# Patient Record
Sex: Female | Born: 1975 | Race: Black or African American | Hispanic: No | Marital: Single | Smoking: Never smoker
Health system: Southern US, Community
[De-identification: ages and names within clinical notes are randomized; demographics above are authoritative.]

---

## 2007-01-11 ENCOUNTER — Other Ambulatory Visit: Admission: RE | Admit: 2007-01-11 | Discharge: 2007-01-11 | Payer: Self-pay | Admitting: Obstetrics and Gynecology

## 2007-01-16 ENCOUNTER — Emergency Department (HOSPITAL_COMMUNITY): Admission: EM | Admit: 2007-01-16 | Discharge: 2007-01-16 | Payer: Self-pay | Admitting: Family Medicine

## 2008-02-19 ENCOUNTER — Other Ambulatory Visit: Admission: RE | Admit: 2008-02-19 | Discharge: 2008-02-19 | Payer: Self-pay | Admitting: Obstetrics and Gynecology

## 2009-03-25 ENCOUNTER — Other Ambulatory Visit: Admission: RE | Admit: 2009-03-25 | Discharge: 2009-03-25 | Payer: Self-pay | Admitting: Obstetrics and Gynecology

## 2009-08-14 ENCOUNTER — Encounter: Admission: RE | Admit: 2009-08-14 | Discharge: 2009-08-14 | Payer: Self-pay | Admitting: Family Medicine

## 2010-07-22 ENCOUNTER — Other Ambulatory Visit: Admission: RE | Admit: 2010-07-22 | Discharge: 2010-07-22 | Payer: Self-pay | Admitting: Obstetrics and Gynecology

## 2010-12-14 ENCOUNTER — Encounter: Payer: Self-pay | Admitting: Family Medicine

## 2012-04-24 ENCOUNTER — Other Ambulatory Visit (HOSPITAL_COMMUNITY)
Admission: RE | Admit: 2012-04-24 | Discharge: 2012-04-24 | Disposition: A | Discharge status: Home / Self Care | Payer: PRIVATE HEALTH INSURANCE | Source: Ambulatory Visit | Attending: Obstetrics and Gynecology | Admitting: Obstetrics and Gynecology

## 2012-04-24 DIAGNOSIS — Z01419 Encounter for gynecological examination (general) (routine) without abnormal findings: Secondary | ICD-10-CM | POA: Insufficient documentation

## 2012-04-24 DIAGNOSIS — Z1159 Encounter for screening for other viral diseases: Secondary | ICD-10-CM | POA: Insufficient documentation

## 2012-04-24 DIAGNOSIS — Z113 Encounter for screening for infections with a predominantly sexual mode of transmission: Secondary | ICD-10-CM | POA: Insufficient documentation

## 2013-01-22 ENCOUNTER — Ambulatory Visit (INDEPENDENT_AMBULATORY_CARE_PROVIDER_SITE_OTHER): Payer: BC Managed Care – PPO | Admitting: Internal Medicine

## 2013-01-22 ENCOUNTER — Encounter: Payer: Self-pay | Admitting: Internal Medicine

## 2013-01-22 VITALS — BP 100/60 | HR 80 | Temp 98.5°F | Ht 61.0 in | Wt 137.6 lb

## 2013-01-22 DIAGNOSIS — R05 Cough: Secondary | ICD-10-CM

## 2013-01-22 MED ORDER — PANTOPRAZOLE SODIUM 40 MG PO TBEC: 40.0000 mg | DELAYED_RELEASE_TABLET | Freq: Every day | ORAL | Status: DC

## 2013-01-22 MED ORDER — FAMOTIDINE 20 MG PO TABS: ORAL_TABLET | ORAL | Status: DC

## 2013-01-22 NOTE — Assessment & Plan Note (Signed)
The most common causes of chronic cough in immunocompetent adults include the following: upper airway cough syndrome (UACS), previously referred to as postnasal drip syndrome (PNDS), which is caused by variety of rhinosinus conditions; (2) asthma; (3) GERD; (4) chronic bronchitis from cigarette smoking or other inhaled environmental irritants; (5) nonasthmatic eosinophilic bronchitis; and (6) bronchiectasis.   These conditions, singly or in combination, have accounted for up to 94% of the causes of chronic cough in prospective studies.   Other conditions have constituted no >6% of the causes in prospective studies These have included bronchogenic carcinoma, chronic interstitial pneumonia, sarcoidosis, left ventricular failure, ACEI-induced cough, and aspiration from a condition associated with pharyngeal dysfunction.    Chronic cough is often simultaneously caused by more than one condition. A single cause has been found from 38 to 82% of the time, multiple causes from 18 to 62%. Multiply caused cough has been the result of three diseases up to 42% of the time.       Of the three most common causes of chronic cough, only one (GERD)  can actually cause the other two (asthma and post nasal drip syndrome)  and perpetuate the cylce of cough inducing airway trauma, inflammation, heightened sensitivity to reflux which is prompted by the cough itself via a cyclical mechanism.    This may partially respond to steroids and look like asthma and post nasal drainage but never erradicated completely unless the cough and the secondary reflux are eliminated, preferably both at the same time.  While not intuitively obvious, many patients with chronic low grade reflux do not cough until there is a secondary insult that disturbs the protective epithelial barrier and exposes sensitive nerve endings.  This can be viral or direct physical injury such as with an endotracheal tube.   The point is that once this occurs, it is  difficult to eliminate using anything but a maximally effective acid suppression regimen at least in the short run, accompanied by an appropriate diet to address non acid GERD.   Discussed with pt The standardized cough guidelines published in Chest by [place] in 2006 are still the best available and consist of a multiple step process (up to 12!) , not a single office visit,  and are intended  to address this problem logically,  with an alogrithm dependent on response to empiric treatment at  each progressive step  to determine a specific diagnosis with  minimal addtional testing needed. Therefore if adherence is an issue or can't be accurately verified,  it's very unlikely the standard evaluation and treatment will be successful here.    Furthermore, response to therapy (other than acute cough suppression, which should only be used short term with avoidance of narcotic containing cough syrups if possible), can be a gradual process for which the patient may perceive immediate benefit.  Unlike going to an eye doctor where the best perscription is almost always the first one and is immediately effective, this is almost never the case in the management of chronic cough syndromes. Therefore the patient needs to commit up front to consistently adhere to recommendations  for up to 6 weeks of therapy directed at the likely underlying problem(s) before the response can be reasonably evaluated.

## 2013-01-22 NOTE — Patient Instructions (Signed)
Try protonix 40 mg  Take 30-60 min before first meal of the day and Pepcid 20 mg one bedtime until cough is completely gone for at least a week without the need for cough suppression  Best cough medication is delsym  GERD (REFLUX)  is an extremely common cause of respiratory symptoms, many times with no significant heartburn at all.    It can be treated with medication, but also with lifestyle changes including avoidance of late meals, excessive alcohol, smoking cessation, and avoid fatty foods, chocolate, peppermint, colas, red wine, and acidic juices such as orange juice.  NO MINT OR MENTHOL PRODUCTS SO NO COUGH DROPS  USE SUGARLESS CANDY INSTEAD (jolley ranchers or Stover's or life saver)  NO OIL BASED VITAMINS - use powdered substitutes.  If not better in 4 weeks call for further evaluation.     Marland Kitchen

## 2013-01-22 NOTE — Progress Notes (Signed)
Subjective:    Patient ID: Sheryl Wagner, female    DOB: [birth year]   MRN: [id number]  HPI  14 ybf never smoker works in med records at [employer] referred by Elvera Lennox for recurrent cough since 2011.  01/22/2013  1st pulmonary eval cc cough tends to come one with heat coming on catching a cold then coughs for months at a time x 3 years, mostly dry assoc with tickle in throat but no excess rhinorrhea, coughs to point of gagging. This episode started abruptly in Nov 2013 and is the longest of the episodes which typically burn themselves out completely for months at a time.  Already tried zpack, prednisone, clariton > zero benefit  Sleeping ok without nocturnal  or early am exacerbation  of respiratory  c/o's or need for noct saba. Also denies any obvious fluctuation of symptoms with weather or environmental changes or other aggravating or alleviating factors except as outlined above     Kouffman Reflux v Neurogenic Cough Differentiator Reflux Comments  Do you awaken from a sound sleep coughing violently?                            With trouble breathing? no   Do you have choking episodes when you cannot  Get enough air, gasping for air ?              Yes   Do you usually cough when you lie down into  The bed, or when you just lie down to rest ?                          no   Do you usually cough after meals or eating?         no   Do you cough when (or after) you bend over?    no   GERD SCORE     Kouffman Reflux v Neurogenic Cough Differentiator Neurogenic   Do you more-or-less cough all day long? Varies    Does change of temperature make you cough? no   Does laughing or chuckling cause you to cough? yes   Do fumes (perfume, automobile fumes, burned  Toast, etc.,) cause you to cough ?      no   Does speaking, singing, or talking on the phone cause you to cough   ?               no   Neurogenic/Airway score         Review of Systems  Constitutional: Negative for fever,  chills and unexpected weight change.  HENT: Negative for ear pain, nosebleeds, congestion, sore throat, rhinorrhea, sneezing, trouble swallowing, dental problem, voice change, postnasal drip and sinus pressure.   Eyes: Negative for visual disturbance.  Respiratory: Positive for cough. Negative for choking and shortness of breath.   Cardiovascular: Negative for chest pain and leg swelling.  Gastrointestinal: Negative for vomiting, abdominal pain and diarrhea.  Genitourinary: Negative for difficulty urinating.  Musculoskeletal: Negative for arthralgias.  Skin: Negative for rash.  Neurological: Negative for tremors, syncope and headaches.  Hematological: Does not bruise/bleed easily.       Objective:   Physical Exam   Amb bf nad  Wt Readings from Last 3 Encounters:  01/22/13 137 lb 9.6 oz (62.415 kg)    HEENT: nl dentition, turbinates, and orophanx. Nl external ear canals without cough reflex   NECK :  without JVD/Nodes/TM/ nl carotid upstrokes bilaterally   LUNGS: no acc muscle use, clear to A and P bilaterally without cough on insp or exp maneuvers   CV:  RRR  no s3 or murmur or increase in P2, no edema   ABD:  soft and nontender with nl excursion in the supine position. No bruits or organomegaly, bowel sounds nl  MS:  warm without deformities, calf tenderness, cyanosis or clubbing  SKIN: warm and dry without lesions    NEURO:  alert, approp, no deficits      Assessment & Plan:

## 2013-02-19 ENCOUNTER — Ambulatory Visit: Payer: BC Managed Care – PPO | Admitting: Internal Medicine

## 2013-10-17 ENCOUNTER — Other Ambulatory Visit (HOSPITAL_COMMUNITY)
Admission: RE | Admit: 2013-10-17 | Discharge: 2013-10-17 | Disposition: A | Discharge status: Home / Self Care | Payer: BC Managed Care – PPO | Source: Ambulatory Visit | Attending: Obstetrics and Gynecology | Admitting: Obstetrics and Gynecology

## 2013-10-17 ENCOUNTER — Other Ambulatory Visit: Payer: Self-pay | Admitting: Obstetrics and Gynecology

## 2013-10-17 DIAGNOSIS — Z113 Encounter for screening for infections with a predominantly sexual mode of transmission: Secondary | ICD-10-CM | POA: Insufficient documentation

## 2013-10-17 DIAGNOSIS — Z01419 Encounter for gynecological examination (general) (routine) without abnormal findings: Secondary | ICD-10-CM | POA: Insufficient documentation

## 2013-10-17 DIAGNOSIS — Z1151 Encounter for screening for human papillomavirus (HPV): Secondary | ICD-10-CM | POA: Insufficient documentation

## 2015-01-06 ENCOUNTER — Other Ambulatory Visit (HOSPITAL_COMMUNITY)
Admission: RE | Admit: 2015-01-06 | Discharge: 2015-01-06 | Disposition: A | Discharge status: Home / Self Care | Payer: BC Managed Care – PPO | Source: Ambulatory Visit | Attending: Obstetrics and Gynecology | Admitting: Obstetrics and Gynecology

## 2015-01-06 ENCOUNTER — Other Ambulatory Visit: Payer: Self-pay | Admitting: Obstetrics and Gynecology

## 2015-01-06 DIAGNOSIS — Z113 Encounter for screening for infections with a predominantly sexual mode of transmission: Secondary | ICD-10-CM | POA: Diagnosis present

## 2015-01-06 DIAGNOSIS — Z01419 Encounter for gynecological examination (general) (routine) without abnormal findings: Secondary | ICD-10-CM | POA: Diagnosis not present

## 2015-01-07 LAB — CYTOLOGY - PAP

## 2016-01-19 ENCOUNTER — Other Ambulatory Visit (HOSPITAL_COMMUNITY)
Admission: RE | Admit: 2016-01-19 | Discharge: 2016-01-19 | Disposition: A | Discharge status: Home / Self Care | Payer: BC Managed Care – PPO | Source: Ambulatory Visit | Attending: Obstetrics and Gynecology | Admitting: Obstetrics and Gynecology

## 2016-01-19 ENCOUNTER — Other Ambulatory Visit: Payer: Self-pay | Admitting: Obstetrics and Gynecology

## 2016-01-19 DIAGNOSIS — Z01419 Encounter for gynecological examination (general) (routine) without abnormal findings: Secondary | ICD-10-CM | POA: Diagnosis present

## 2016-01-19 DIAGNOSIS — Z113 Encounter for screening for infections with a predominantly sexual mode of transmission: Secondary | ICD-10-CM | POA: Diagnosis present

## 2016-01-21 LAB — CYTOLOGY - PAP

## 2016-02-09 ENCOUNTER — Ambulatory Visit: Payer: BC Managed Care – PPO | Admitting: Podiatry

## 2016-02-11 ENCOUNTER — Ambulatory Visit: Payer: BC Managed Care – PPO | Admitting: Podiatry

## 2016-02-20 ENCOUNTER — Ambulatory Visit (INDEPENDENT_AMBULATORY_CARE_PROVIDER_SITE_OTHER): Payer: BC Managed Care – PPO

## 2016-02-20 ENCOUNTER — Encounter: Payer: Self-pay | Admitting: Podiatry

## 2016-02-20 ENCOUNTER — Ambulatory Visit (INDEPENDENT_AMBULATORY_CARE_PROVIDER_SITE_OTHER): Payer: BC Managed Care – PPO | Admitting: Podiatry

## 2016-02-20 VITALS — BP 92/53 | HR 83 | Resp 16 | Ht 61.0 in | Wt 126.0 lb

## 2016-02-20 DIAGNOSIS — M21619 Bunion of unspecified foot: Secondary | ICD-10-CM | POA: Diagnosis not present

## 2016-02-20 DIAGNOSIS — M779 Enthesopathy, unspecified: Secondary | ICD-10-CM

## 2016-02-20 NOTE — Progress Notes (Signed)
   Subjective:    Patient ID: Sheryl Wagner, female    DOB: 12-May-1976, 40 y.o.   MRN: [id number]  HPI Patient presents with bilateral foot pain; bunions; Bilateral; bunions; pt stated, "Left foot hurts worse and wants to discuss getting surgery done on Left foot"   Review of Systems  All other systems reviewed and are negative.      Objective:   Physical Exam        Assessment & Plan:

## 2016-02-20 NOTE — Progress Notes (Signed)
Subjective:     Patient ID: Sheryl Wagner, female   DOB: 04-12-1976, [age] y.o.   MRN: [id number]  HPI patient presents stating she's had a very painful bunion on her left foot that's making walking difficult and she also has one on the right that's not as sore. Has family history of this condition and no she needs to have a corrected and presents for consult for correction. States she's tried wider shoes and other modalities   Review of Systems  All other systems reviewed and are negative.      Objective:   Physical Exam  Constitutional: She is oriented to person, place, and time.  Cardiovascular: Intact distal pulses.   Musculoskeletal: Normal range of motion.  Neurological: She is oriented to person, place, and time.  Skin: Skin is warm.  Nursing note and vitals reviewed.  neurovascular status found to be intact muscle strength adequate range of motion within normal limits with patient found to have hyperostosis medial aspect first metatarsal head left with redness around the joint surface and pain when palpated. There is deviation of the hallux against the second toe and there is good range of motion first MPJ bilateral. Mild deformity noted on right and patient's found to have good digital perfusion and is well oriented 3     Assessment:     Structural bunion deformity left over right with symptomatic redness and pain around the first metatarsal head left and mild deviation of the hallux left over right    Plan:     H&P and condition reviewed with patient at great length. Due to symptomatic findings and failure to respond to different conservative modalities she has opted for surgical intervention and I discussed her x-rays with her and recommended distal osteotomy even that we may not get complete correction on the x-ray I do believe clinically it will function very well. Patient wants surgery and at this point wants to do consent form and she came here for the procedure and I allowed  her to read a consent form reviewing alternative treatments and complications as listed. She understands total recovery. Can take approximate 6 months and there is no long-term guarantees and she signs consent form after review. She is given all preoperative instructions and walking boot with instructions on getting used to it prior to procedure and is scheduled for outpatient surgery. She is encouraged to call with any questions prior to procedure  X-ray report left indicate elevation of the intermetatarsal angle between the first and second metatarsals of approximate 17 and 13 on the right. There is still tibial sesamoidal shift position left over right

## 2016-02-20 NOTE — Patient Instructions (Signed)
Pre-Operative Instructions  Congratulations, you have decided to take an important step to improving your quality of life.  You can be assured that the doctors of [facility] will be with you every step of the way.  1. Plan to be at the surgery center/hospital at least 1 (one) hour prior to your scheduled time unless otherwise directed by the surgical center/hospital staff.  You must have a responsible adult accompany you, remain during the surgery and drive you home.  Make sure you have directions to the surgical center/hospital and know how to get there on time. 2. For hospital based surgery you will need to obtain a history and physical form from your family physician within 1 month prior to the date of surgery- we will give you a form for you primary physician.  3. We make every effort to accommodate the date you request for surgery.  There are however, times where surgery dates or times have to be moved.  We will contact you as soon as possible if a change in schedule is required.   4. No Aspirin/Ibuprofen for one week before surgery.  If you are on aspirin, any non-steroidal anti-inflammatory medications (Mobic, Aleve, Ibuprofen) you should stop taking it 7 days prior to your surgery.  You make take Tylenol  For pain prior to surgery.  5. Medications- If you are taking daily heart and blood pressure medications, seizure, reflux, allergy, asthma, anxiety, pain or diabetes medications, make sure the surgery center/hospital is aware before the day of surgery so they may notify you which medications to take or avoid the day of surgery. 6. No food or drink after midnight the night before surgery unless directed otherwise by surgical center/hospital staff. 7. No alcoholic beverages 24 hours prior to surgery.  No smoking 24 hours prior to or 24 hours after surgery. 8. Wear loose pants or shorts- loose enough to fit over bandages, boots, and casts. 9. No slip on shoes, sneakers are best. 10. Bring  your boot with you to the surgery center/hospital.  Also bring crutches or a walker if your physician has prescribed it for you.  If you do not have this equipment, it will be provided for you after surgery. 11. If you have not been contracted by the surgery center/hospital by the day before your surgery, call to confirm the date and time of your surgery. 12. Leave-time from work may vary depending on the type of surgery you have.  Appropriate arrangements should be made prior to surgery with your employer. 13. Prescriptions will be provided immediately following surgery by your doctor.  Have these filled as soon as possible after surgery and take the medication as directed. 14. Remove nail polish on the operative foot. 15. Wash the night before surgery.  The night before surgery wash the foot and leg well with the antibacterial soap provided and water paying special attention to beneath the toenails and in between the toes.  Rinse thoroughly with water and dry well with a towel.  Perform this wash unless told not to do so by your physician.  Enclosed: 1 Ice pack (please put in freezer the night before surgery)   1 Hibiclens skin cleaner   Pre-op Instructions  If you have any questions regarding the instructions, do not hesitate to call our office.  [place]: [address] , [place] [address] [phone]  [place]: [address] [place], [place] [address] [phone]  [place]: [address]  [place], [place] [address] [phone]  Dr. Richard   Tuchman DPM, Dr. Yates Weisgerber DPM Dr. Richard Sikora DPM, Dr. M. Todd Hyatt DPM, Dr. Kathryn Egerton DPM 

## 2016-03-09 ENCOUNTER — Encounter: Payer: Self-pay | Admitting: Podiatry

## 2016-03-09 DIAGNOSIS — M2012 Hallux valgus (acquired), left foot: Secondary | ICD-10-CM | POA: Diagnosis not present

## 2016-03-16 NOTE — Progress Notes (Signed)
DOS 03/09/2016 Austin Bunionectomy (cutting and moving bone) with fixation left foot.

## 2016-03-18 ENCOUNTER — Ambulatory Visit (INDEPENDENT_AMBULATORY_CARE_PROVIDER_SITE_OTHER): Payer: BC Managed Care – PPO

## 2016-03-18 ENCOUNTER — Encounter: Payer: Self-pay | Admitting: Podiatry

## 2016-03-18 ENCOUNTER — Ambulatory Visit (INDEPENDENT_AMBULATORY_CARE_PROVIDER_SITE_OTHER): Payer: BC Managed Care – PPO | Admitting: Podiatry

## 2016-03-18 VITALS — BP 99/70 | HR 69 | Resp 12

## 2016-03-18 DIAGNOSIS — M21619 Bunion of unspecified foot: Secondary | ICD-10-CM | POA: Diagnosis not present

## 2016-03-18 DIAGNOSIS — Z9889 Other specified postprocedural states: Secondary | ICD-10-CM | POA: Diagnosis not present

## 2016-03-18 NOTE — Progress Notes (Signed)
Subjective:     Patient ID: Sheryl Wagner, female   DOB: 01-Nov-1976, 40 y.o.   MRN: [id number]  HPI patient states I'm doing real well with my foot with minimal discomfort good alignment and good range of motion   Review of Systems     Objective:   Physical Exam Neurovascular status intact muscle strength adequate with good alignment of the first MPJ left with digit in good position wound edges well coapted    Assessment:     Doing well post osteotomy first metatarsal left    Plan:     H&P and x-rays reviewed with patient. I went ahead today and I have recommended continued elevation compression immobilization and dispensed surgical shoe. Patient will be seen back 2 weeks or earlier if needed  X-ray report indicates pins are in place good alignment is noted with good reduction of the IM angle

## 2016-03-19 ENCOUNTER — Telehealth: Payer: Self-pay | Admitting: *Deleted

## 2016-03-19 NOTE — Telephone Encounter (Signed)
Sheryl HongJudy - [employer] states pt is to return 03/22/2016, with restriction for walking and standing, and she needs a projected length of time they are to provide pt with these accommodations.

## 2016-04-08 ENCOUNTER — Ambulatory Visit (INDEPENDENT_AMBULATORY_CARE_PROVIDER_SITE_OTHER): Payer: BC Managed Care – PPO | Admitting: Podiatry

## 2016-04-08 ENCOUNTER — Ambulatory Visit (INDEPENDENT_AMBULATORY_CARE_PROVIDER_SITE_OTHER): Payer: BC Managed Care – PPO

## 2016-04-08 ENCOUNTER — Encounter: Payer: Self-pay | Admitting: Podiatry

## 2016-04-08 VITALS — BP 88/67 | HR 94 | Resp 16

## 2016-04-08 DIAGNOSIS — Z9889 Other specified postprocedural states: Secondary | ICD-10-CM | POA: Diagnosis not present

## 2016-04-08 DIAGNOSIS — M21619 Bunion of unspecified foot: Secondary | ICD-10-CM

## 2016-04-08 NOTE — Progress Notes (Signed)
Subjective:     Patient ID: Sheryl Wagner, female   DOB: October 04, 1976, 40 y.o.   MRN: [id number]  HPI patient states she's feeling real well with her left foot and having minimal discomfort   Review of Systems     Objective:   Physical Exam Neurovascular status intact negative Homans sign noted with hallux in rectus position left with good alignment noted and good range of motion    Assessment:     Doing well post Austin-type osteotomy left    Plan:     X-ray reviewed and allow patient to return to soft shoe gear with instructions on range of motion exercises. Reappoint 4 weeks or earlier if needed  X-ray report indicated osteotomy is healing well with pins in place and good alignment of the first metatarsal

## 2016-05-21 ENCOUNTER — Ambulatory Visit (INDEPENDENT_AMBULATORY_CARE_PROVIDER_SITE_OTHER): Payer: BC Managed Care – PPO | Admitting: Podiatry

## 2016-05-21 ENCOUNTER — Ambulatory Visit (INDEPENDENT_AMBULATORY_CARE_PROVIDER_SITE_OTHER): Payer: BC Managed Care – PPO

## 2016-05-21 DIAGNOSIS — M21619 Bunion of unspecified foot: Secondary | ICD-10-CM | POA: Diagnosis not present

## 2016-05-21 DIAGNOSIS — Z9889 Other specified postprocedural states: Secondary | ICD-10-CM

## 2016-05-21 NOTE — Progress Notes (Signed)
Subjective:     Patient ID: Sheryl Wagner, female   DOB: 1976/09/30, 40 y.o.   MRN: [id number]  HPI patient presents stating I'm doing pretty well but I feel like I don't have full motion but I am able to wear shoe gear without pain. I also could use support of my arches   Review of Systems     Objective:   Physical Exam Neurovascular status intact muscle strength adequate with excellent range of motion first MPJ but patient does have significant flattening the arch noted bilateral    Assessment:     Doing very well post osteotomy first metatarsal left with no indications of sequela and I explained its normal not to have as much motion as the other foot    Plan:     Reviewed x-rays and discussed long-term orthotics which we'll make it next visit. Patient at this time can resume normal activities and will be seen back in 2 months  X-ray report indicates that the osteotomy is healing very well with joint congruence and no indications of pathology

## 2016-07-16 ENCOUNTER — Ambulatory Visit (INDEPENDENT_AMBULATORY_CARE_PROVIDER_SITE_OTHER): Payer: BC Managed Care – PPO

## 2016-07-16 ENCOUNTER — Encounter: Payer: Self-pay | Admitting: Podiatry

## 2016-07-16 ENCOUNTER — Ambulatory Visit (INDEPENDENT_AMBULATORY_CARE_PROVIDER_SITE_OTHER): Payer: BC Managed Care – PPO | Admitting: Podiatry

## 2016-07-16 DIAGNOSIS — M2012 Hallux valgus (acquired), left foot: Secondary | ICD-10-CM

## 2016-07-16 DIAGNOSIS — Z9889 Other specified postprocedural states: Secondary | ICD-10-CM | POA: Diagnosis not present

## 2016-07-16 DIAGNOSIS — M779 Enthesopathy, unspecified: Secondary | ICD-10-CM | POA: Diagnosis not present

## 2016-07-16 NOTE — Progress Notes (Signed)
Subjective:     Patient ID: Sheryl Wagner, female   DOB: December 19, 1975, 40 y.o.   MRN: [id number]  HPI patient states that she's doing well and is walking well but knows that she's getting need inserts due to her flat arch   Review of Systems     Objective:   Physical Exam Neurovascular status intact negative Homans sign noted with excellent healing first metatarsal left with hallux in rectus position wound edges well coapted Range of motion. Patient's found to have depression of the arch noted left    Assessment:     Tendinitis-like symptomatology secondary to structure    Plan:     H&P x-ray reviewed left and scanned for custom orthotics with patient to be seen back  X-ray report indicates excellent healing of osteotomy left with pins in place and no movement

## 2016-08-10 ENCOUNTER — Ambulatory Visit: Payer: BC Managed Care – PPO | Admitting: *Deleted

## 2016-08-10 DIAGNOSIS — M779 Enthesopathy, unspecified: Secondary | ICD-10-CM

## 2016-08-10 NOTE — Progress Notes (Signed)
Patient ID: Sheryl Wagner, female   DOB: 30-Jun-1976, 40 y.o.   MRN: [id number]  Patient presents for orthotic pick up.  Verbal and written break in and wear instructions given.  Patient will follow up in 4 weeks if symptoms worsen or fail to improve.

## 2016-08-10 NOTE — Patient Instructions (Signed)

## 2016-09-03 ENCOUNTER — Ambulatory Visit: Payer: BC Managed Care – PPO | Admitting: *Deleted

## 2016-09-03 DIAGNOSIS — M779 Enthesopathy, unspecified: Secondary | ICD-10-CM

## 2016-09-03 NOTE — Progress Notes (Signed)
Patient ID: Sheryl Wagner, female   DOB: 12-10-1975, 40 y.o.   MRN: [id number]  Patient presents with complaint that orthotics are too wide for her shoes.  Verified that the orthotics fit appropriately to her foot.  Shoes that patient presented with are heels and flat ballet slipper type shoes.  Explained why the shapes of the shoes were not working with the orthotics, showing the unnaturally narrow base and very shallow depth.  Also gave her visual demonstration of orthotics to her feet and the fit of the shoes to her feet.  Patient stated she would wait to have them narrowed now that she has a better understanding and be more conscious of the shoes she is purchasing, she will take her orthotics with her when she goes shoe shopping.  We agreed that if it continues to be a problem she will come back in to discuss it further with Dr Charlsie Merlesegal.

## 2017-01-24 ENCOUNTER — Other Ambulatory Visit (HOSPITAL_COMMUNITY)
Admission: RE | Admit: 2017-01-24 | Discharge: 2017-01-24 | Disposition: A | Discharge status: Home / Self Care | Payer: BC Managed Care – PPO | Source: Ambulatory Visit | Attending: Obstetrics and Gynecology | Admitting: Obstetrics and Gynecology

## 2017-01-24 ENCOUNTER — Other Ambulatory Visit: Payer: Self-pay | Admitting: Obstetrics and Gynecology

## 2017-01-24 DIAGNOSIS — Z01419 Encounter for gynecological examination (general) (routine) without abnormal findings: Secondary | ICD-10-CM | POA: Insufficient documentation

## 2017-01-24 DIAGNOSIS — Z1151 Encounter for screening for human papillomavirus (HPV): Secondary | ICD-10-CM | POA: Diagnosis present

## 2017-01-27 LAB — CYTOLOGY - PAP
DIAGNOSIS: NEGATIVE
HPV (WINDOPATH): NOT DETECTED

## 2017-08-31 ENCOUNTER — Ambulatory Visit (INDEPENDENT_AMBULATORY_CARE_PROVIDER_SITE_OTHER): Payer: BC Managed Care – PPO

## 2017-08-31 ENCOUNTER — Other Ambulatory Visit: Payer: Self-pay | Admitting: Podiatry

## 2017-08-31 ENCOUNTER — Ambulatory Visit (INDEPENDENT_AMBULATORY_CARE_PROVIDER_SITE_OTHER): Payer: BC Managed Care – PPO | Admitting: Podiatry

## 2017-08-31 DIAGNOSIS — M21611 Bunion of right foot: Secondary | ICD-10-CM | POA: Diagnosis not present

## 2017-08-31 DIAGNOSIS — M21619 Bunion of unspecified foot: Secondary | ICD-10-CM | POA: Diagnosis not present

## 2017-08-31 DIAGNOSIS — M79671 Pain in right foot: Secondary | ICD-10-CM

## 2017-08-31 NOTE — Patient Instructions (Signed)
Pre-Operative Instructions  Congratulations, you have decided to take an important step towards improving your quality of life.  You can be assured that the doctors and staff at [facility] will be with you every step of the way.  Here are some important things you should know:  1. Plan to be at the surgery center/hospital at least 1 (one) hour prior to your scheduled time, unless otherwise directed by the surgical center/hospital staff.  You must have a responsible adult accompany you, remain during the surgery and drive you home.  Make sure you have directions to the surgical center/hospital to ensure you arrive on time. 2. If you are having surgery at [facility] or [facility], you will need a copy of your medical history and physical form from your family physician within one month prior to the date of surgery. We will give you a form for your primary physician to complete.  3. We make every effort to accommodate the date you request for surgery.  However, there are times where surgery dates or times have to be moved.  We will contact you as soon as possible if a change in schedule is required.   4. No aspirin/ibuprofen for one week before surgery.  If you are on aspirin, any non-steroidal anti-inflammatory medications (Mobic, Aleve, Ibuprofen) should not be taken seven (7) days prior to your surgery.  You make take Tylenol for pain prior to surgery.  5. Medications - If you are taking daily heart and blood pressure medications, seizure, reflux, allergy, asthma, anxiety, pain or diabetes medications, make sure you notify the surgery center/hospital before the day of surgery so they can tell you which medications you should take or avoid the day of surgery. 6. No food or drink after midnight the night before surgery unless directed otherwise by surgical center/hospital staff. 7. No alcoholic beverages 24-hours prior to surgery.  No smoking 24-hours prior or 24-hours after  surgery. 8. Wear loose pants or shorts. They should be loose enough to fit over bandages, boots, and casts. 9. Don't wear slip-on shoes. Sneakers are preferred. 10. Bring your boot with you to the surgery center/hospital.  Also bring crutches or a walker if your physician has prescribed it for you.  If you do not have this equipment, it will be provided for you after surgery. 11. If you have not been contacted by the surgery center/hospital by the day before your surgery, call to confirm the date and time of your surgery. 12. Leave-time from work may vary depending on the type of surgery you have.  Appropriate arrangements should be made prior to surgery with your employer. 13. Prescriptions will be provided immediately following surgery by your doctor.  Fill these as soon as possible after surgery and take the medication as directed. Pain medications will not be refilled on weekends and must be approved by the doctor. 14. Remove nail polish on the operative foot and avoid getting pedicures prior to surgery. 15. Wash the night before surgery.  The night before surgery wash the foot and leg well with water and the antibacterial soap provided. Be sure to pay special attention to beneath the toenails and in between the toes.  Wash for at least three (3) minutes. Rinse thoroughly with water and dry well with a towel.  Perform this wash unless told not to do so by your physician.  Enclosed: 1 Ice pack (please put in freezer the night before surgery)   1 Hibiclens skin cleaner     Pre-op instructions  If you have any questions regarding the instructions, please do not hesitate to call our office.  [place]: [address], [place], [place] [address] -- [phone]  [place]: [address] , [place] [address] -- [phone]  [place]: [address]  [place], [place] [address] -- [phone]  [place]: [address], [place], [place] [address] -- [phone]  Website:  [email or web address] 

## 2017-09-01 ENCOUNTER — Ambulatory Visit: Payer: BC Managed Care – PPO | Admitting: Podiatry

## 2017-09-01 NOTE — Progress Notes (Signed)
Subjective:    Patient ID: Sheryl Wagner, female   DOB: 41 y.o.   MRN: [id number]   HPI patient states the bunion on my right foot has really been bothering me and the left one that was fixed is doing great and I know I'm getting need to have this one fixed like the other. 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 Due to young age and advanced deformity and history with the left she wants the right fixed and at this time I allowed her to read a consent form for correction going over all possible complications and alternative treatments as listed. Patient wants surgery signed consent form and was dispensed a air fracture walker for the right one and was explained how to use this. Patient understands total recovery can take 6 months to one year and discussed 1 did so well no guarantee the other one well  X-ray indicates that the patient has an elevation of the intermetatarsal angle with enlargement of bone around the first metatarsal head right

## 2017-09-27 ENCOUNTER — Encounter: Payer: Self-pay | Admitting: Podiatry

## 2017-09-27 DIAGNOSIS — M2011 Hallux valgus (acquired), right foot: Secondary | ICD-10-CM | POA: Diagnosis not present

## 2017-09-29 ENCOUNTER — Telehealth: Payer: Self-pay | Admitting: Podiatry

## 2017-09-29 NOTE — Telephone Encounter (Signed)
Left message encouraging pt to call with concerns, and I gave he an over view of her post op instructions, not to be up on the surgery foot or have below the heart more than 15 minutes per hour, remain in the boot at all times even to sleep, keep the dressing in place clean and dry until the 1st post op checks.

## 2017-09-29 NOTE — Telephone Encounter (Signed)
I'm calling in regards to the bunion surgery I had on Tuesday. Please call me back at [phone].

## 2017-09-30 NOTE — Telephone Encounter (Signed)
Left message to call with concerns. 

## 2017-09-30 NOTE — Telephone Encounter (Signed)
Yes, this is Sheryl Wagner calling back regarding the bunion surgery I had on Tuesday. When you get this message, can you please return my call at [phone]. Thank you. Bye.

## 2017-10-03 NOTE — Progress Notes (Signed)
DOS 09/27/2017 [place] Bunionectomy RT

## 2017-10-05 ENCOUNTER — Ambulatory Visit (INDEPENDENT_AMBULATORY_CARE_PROVIDER_SITE_OTHER): Payer: BC Managed Care – PPO | Admitting: Podiatry

## 2017-10-05 ENCOUNTER — Ambulatory Visit (INDEPENDENT_AMBULATORY_CARE_PROVIDER_SITE_OTHER): Payer: BC Managed Care – PPO

## 2017-10-05 ENCOUNTER — Encounter: Payer: Self-pay | Admitting: Podiatry

## 2017-10-05 VITALS — BP 103/61 | HR 72 | Temp 98.3°F

## 2017-10-05 DIAGNOSIS — M21619 Bunion of unspecified foot: Secondary | ICD-10-CM

## 2017-10-05 NOTE — Progress Notes (Signed)
Subjective:    Patient ID: Sheryl Wagner, female   DOB: 41 y.o.   MRN: [id number]   HPI patient states doing really well with her right foot with minimal discomfort and able to walk distances without pain    ROS      Objective:  Physical Exam neurovascular status intact with well-healing surgical site right with wound edges well coapted hallux in rectus position good range of motion and no crepitus within the joint and negative Homans sign     Assessment:  Doing well post osteotomy first metatarsal right   Plan:    X-rays taken indicated that the Osteonics healing well with wound edges well coapted joint congruence fixation in place. Advised on continued elevation compression immobilization and reapplied sterile dressing and reappoint in 3 weeks or earlier if needed  X-rays indicated that the osteotomy is healing well with no signs of pathology

## 2017-10-12 ENCOUNTER — Encounter: Payer: Self-pay | Admitting: Podiatry

## 2017-10-18 ENCOUNTER — Telehealth: Payer: Self-pay | Admitting: Podiatry

## 2017-10-18 NOTE — Telephone Encounter (Signed)
Pt states she is getting a sharp pain in her toe when walking or standing, it is stabbing and she is wearing her surgical shoe. I offered pt an earlier appt than next week and she accepted, transferred pt to schedulers.

## 2017-10-18 NOTE — Telephone Encounter (Signed)
I need to speak to a nurse. Can you please call me back at [phone]. Thank you so much. Bye.

## 2017-10-20 ENCOUNTER — Ambulatory Visit (INDEPENDENT_AMBULATORY_CARE_PROVIDER_SITE_OTHER): Payer: BC Managed Care – PPO

## 2017-10-20 ENCOUNTER — Ambulatory Visit (INDEPENDENT_AMBULATORY_CARE_PROVIDER_SITE_OTHER): Payer: BC Managed Care – PPO | Admitting: Podiatry

## 2017-10-20 ENCOUNTER — Encounter: Payer: Self-pay | Admitting: Podiatry

## 2017-10-20 VITALS — BP 99/63 | HR 68 | Resp 16

## 2017-10-20 DIAGNOSIS — M779 Enthesopathy, unspecified: Secondary | ICD-10-CM

## 2017-10-20 DIAGNOSIS — M2011 Hallux valgus (acquired), right foot: Secondary | ICD-10-CM

## 2017-10-20 DIAGNOSIS — M2012 Hallux valgus (acquired), left foot: Secondary | ICD-10-CM

## 2017-10-20 NOTE — Progress Notes (Signed)
Subjective:   Patient ID: Sheryl Wagner, female   DOB: 41 y.o.   MRN: [id number]   HPI Patient presents stating that she is having pain in the joint and she was concerned that there could be a problem.  Patient is now 3 weeks after osteotomy surgery of the first metatarsal right foot.   ROS      Objective:  Physical Exam  Patient presents with restriction of motion of the first MPJ with mild edema and I found the wound edges to be well coapted with no drainage erythema or edema.  Patient is noted to have negative Homans sign and there is discomfort when I move the joint but no other pathology at the current time.     Assessment:  This patient appears to have been inflammatory process with no indications of bone pathology.  There is restriction of the joint but it appears the patient is nervous about allowing this to be moved and it could be that she needs encouragement for this process to occur.      Plan:  H&P and x-rays reviewed with patient.  At this point I have recommended physical therapy to encourage motion of the joint and reduce edema and I encouraged her to walk on the entire foot and I also went over the fact that the x-rays look excellent.  X-ray evaluation indicates the osteotomy is healing well with fixation in place and no indications of movement with the joint congruous and alignment excellent

## 2017-10-25 ENCOUNTER — Other Ambulatory Visit: Payer: Self-pay

## 2017-10-25 MED ORDER — DICLOFENAC SODIUM 75 MG PO TBEC: 75.0000 mg | DELAYED_RELEASE_TABLET | Freq: Two times a day (BID) | ORAL | 0 refills | Status: AC

## 2017-10-26 ENCOUNTER — Other Ambulatory Visit: Payer: BC Managed Care – PPO

## 2017-10-27 ENCOUNTER — Other Ambulatory Visit: Payer: BC Managed Care – PPO | Admitting: Podiatry

## 2017-11-09 ENCOUNTER — Ambulatory Visit (INDEPENDENT_AMBULATORY_CARE_PROVIDER_SITE_OTHER): Payer: BC Managed Care – PPO | Admitting: Podiatry

## 2017-11-09 ENCOUNTER — Encounter: Payer: Self-pay | Admitting: Podiatry

## 2017-11-09 ENCOUNTER — Ambulatory Visit (INDEPENDENT_AMBULATORY_CARE_PROVIDER_SITE_OTHER): Payer: BC Managed Care – PPO

## 2017-11-09 DIAGNOSIS — M2011 Hallux valgus (acquired), right foot: Secondary | ICD-10-CM | POA: Diagnosis not present

## 2017-11-09 NOTE — Progress Notes (Signed)
Subjective:   Patient ID: Sheryl Wagner, female   DOB: 41 y.o.   MRN: [id number]   HPI Patient states she has been doing well but she is concerned because she feels like it is taking longer than her other foot   ROS      Objective:  Physical Exam  Neurovascular status intact negative Homans sign noted with patient's right first MPJ actually doing very well for 5 weeks after surgery with good range of motion no crepitus wound edges that are well coapted     Assessment:  I do think patient is making a satisfactory recovery for this type of the procedure and I explained that to her and the fact that I am satisfied with the range of motion and it appears physical therapy has been of benefit     Plan:  She will have 2 more sessions of physical therapy she will continue with elevation compression and gradually increase shoe gear usage and will be seen back in 4 weeks or earlier if needed  X-ray indicates osteotomy is healing well fixation in place joint congruence no signs of motion

## 2017-12-07 ENCOUNTER — Ambulatory Visit (INDEPENDENT_AMBULATORY_CARE_PROVIDER_SITE_OTHER): Payer: BC Managed Care – PPO

## 2017-12-07 ENCOUNTER — Encounter: Payer: Self-pay | Admitting: Podiatry

## 2017-12-07 ENCOUNTER — Ambulatory Visit (INDEPENDENT_AMBULATORY_CARE_PROVIDER_SITE_OTHER): Payer: BC Managed Care – PPO | Admitting: Podiatry

## 2017-12-07 DIAGNOSIS — Z9889 Other specified postprocedural states: Secondary | ICD-10-CM

## 2017-12-07 DIAGNOSIS — M21619 Bunion of unspecified foot: Secondary | ICD-10-CM | POA: Diagnosis not present

## 2017-12-07 DIAGNOSIS — M2011 Hallux valgus (acquired), right foot: Secondary | ICD-10-CM

## 2017-12-08 NOTE — Progress Notes (Signed)
Subjective:   Patient ID: Sheryl Wagner, female   DOB: 42 y.o.   MRN: [id number]   HPI Patient states my foot seems to be feeling quite a bit better   ROS      Objective:  Physical Exam  Neurovascular status intact negative Homans sign noted with patient's right foot healing well with wound edges well coapted hallux in rectus position joint congruous with good range of motion and no restriction currently     Assessment:  Doing better after having hallux bunion repair right     Plan:  H&P x-ray reviewed and today I advised on the importance of continued range of motion she would do several more physical therapy visits.  Overall is making good improvement and I encouraged her to continue to walk on that side of her foot  X-rays indicate that the osteotomy is healing well with fixation in place joint congruence

## 2019-06-01 ENCOUNTER — Other Ambulatory Visit: Payer: Self-pay | Admitting: Obstetrics and Gynecology

## 2019-06-01 DIAGNOSIS — Z1231 Encounter for screening mammogram for malignant neoplasm of breast: Secondary | ICD-10-CM

## 2019-07-19 ENCOUNTER — Other Ambulatory Visit: Payer: Self-pay

## 2019-07-19 ENCOUNTER — Ambulatory Visit
Admission: RE | Admit: 2019-07-19 | Discharge: 2019-07-19 | Disposition: A | Discharge status: Home / Self Care | Payer: BC Managed Care – PPO | Source: Ambulatory Visit | Attending: Obstetrics and Gynecology | Admitting: Obstetrics and Gynecology

## 2019-07-19 DIAGNOSIS — Z1231 Encounter for screening mammogram for malignant neoplasm of breast: Secondary | ICD-10-CM

## 2020-07-24 ENCOUNTER — Other Ambulatory Visit: Payer: Self-pay | Admitting: Obstetrics and Gynecology

## 2020-07-24 DIAGNOSIS — Z1231 Encounter for screening mammogram for malignant neoplasm of breast: Secondary | ICD-10-CM

## 2020-08-11 ENCOUNTER — Other Ambulatory Visit: Payer: Self-pay

## 2020-08-11 ENCOUNTER — Ambulatory Visit
Admission: RE | Admit: 2020-08-11 | Discharge: 2020-08-11 | Disposition: A | Discharge status: Home / Self Care | Payer: BC Managed Care – PPO | Source: Ambulatory Visit | Attending: Obstetrics and Gynecology | Admitting: Obstetrics and Gynecology

## 2020-08-11 DIAGNOSIS — Z1231 Encounter for screening mammogram for malignant neoplasm of breast: Secondary | ICD-10-CM

## 2020-08-13 ENCOUNTER — Other Ambulatory Visit: Payer: Self-pay | Admitting: Obstetrics and Gynecology

## 2020-08-13 DIAGNOSIS — R928 Other abnormal and inconclusive findings on diagnostic imaging of breast: Secondary | ICD-10-CM

## 2020-08-21 ENCOUNTER — Ambulatory Visit: Payer: BC Managed Care – PPO

## 2020-11-12 ENCOUNTER — Other Ambulatory Visit: Payer: Self-pay

## 2020-11-12 ENCOUNTER — Ambulatory Visit
Admission: RE | Admit: 2020-11-12 | Discharge: 2020-11-12 | Disposition: A | Discharge status: Home / Self Care | Payer: BC Managed Care – PPO | Source: Ambulatory Visit | Attending: Obstetrics and Gynecology | Admitting: Obstetrics and Gynecology

## 2020-11-12 ENCOUNTER — Ambulatory Visit: Payer: BC Managed Care – PPO

## 2020-11-12 DIAGNOSIS — R928 Other abnormal and inconclusive findings on diagnostic imaging of breast: Secondary | ICD-10-CM

## 2020-11-13 ENCOUNTER — Other Ambulatory Visit: Payer: BC Managed Care – PPO

## 2022-06-14 ENCOUNTER — Other Ambulatory Visit: Payer: Self-pay | Admitting: Obstetrics and Gynecology

## 2022-06-14 DIAGNOSIS — Z1231 Encounter for screening mammogram for malignant neoplasm of breast: Secondary | ICD-10-CM

## 2022-06-23 ENCOUNTER — Ambulatory Visit: Payer: 59

## 2022-07-02 ENCOUNTER — Ambulatory Visit
Admission: RE | Admit: 2022-07-02 | Discharge: 2022-07-02 | Disposition: A | Discharge status: Home / Self Care | Payer: 59 | Source: Ambulatory Visit | Attending: Obstetrics and Gynecology | Admitting: Obstetrics and Gynecology

## 2022-07-02 DIAGNOSIS — Z1231 Encounter for screening mammogram for malignant neoplasm of breast: Secondary | ICD-10-CM

## 2022-08-29 IMAGING — MG MM DIGITAL DIAGNOSTIC UNILAT*R* W/ TOMO W/ CAD
4 series · 4 of 12 positions shown · non-contrast
Comparison: Previous exam(s).

CLINICAL DATA: 44-year-old female recalled from screening mammogram
dated 08/11/2020 for a possible right breast asymmetry.

EXAM:
DIGITAL DIAGNOSTIC UNILATERAL RIGHT MAMMOGRAM WITH TOMO AND CAD

[R CC synth-2D]
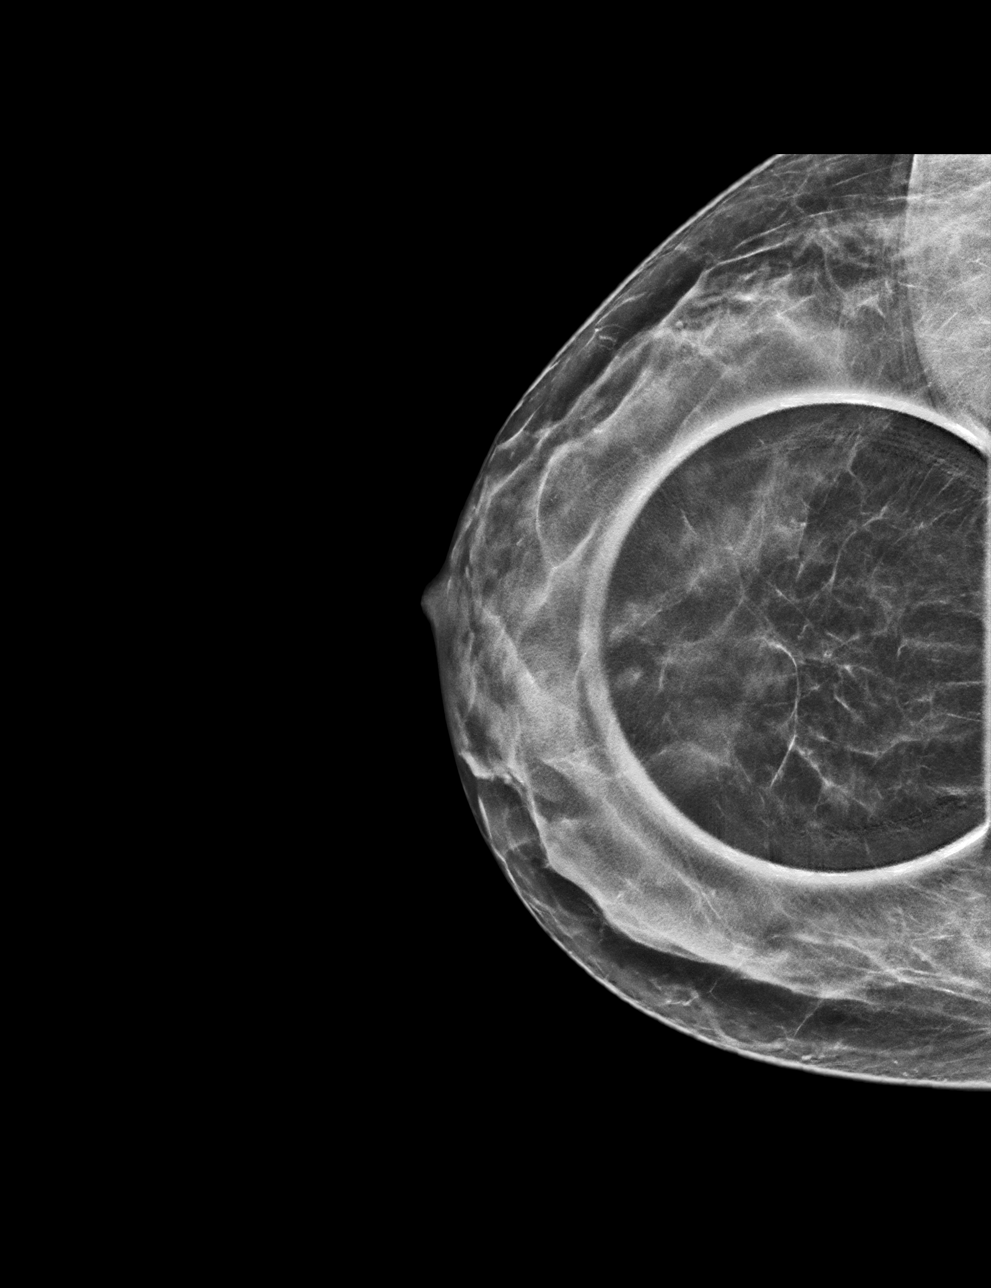

[R ML synth-2D]
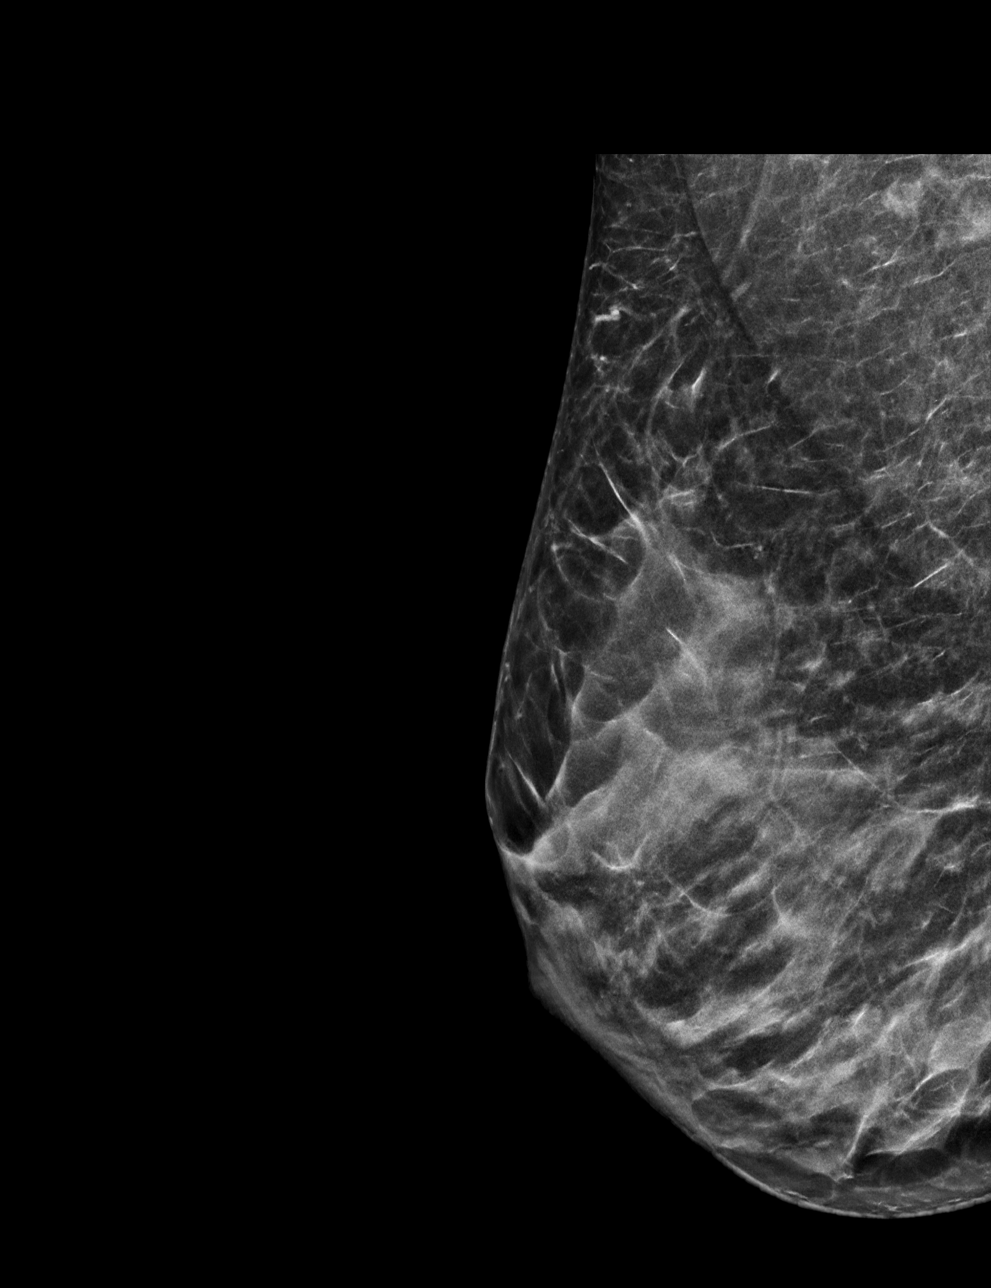

[R CC tomo · tomo slice 27/52.0]
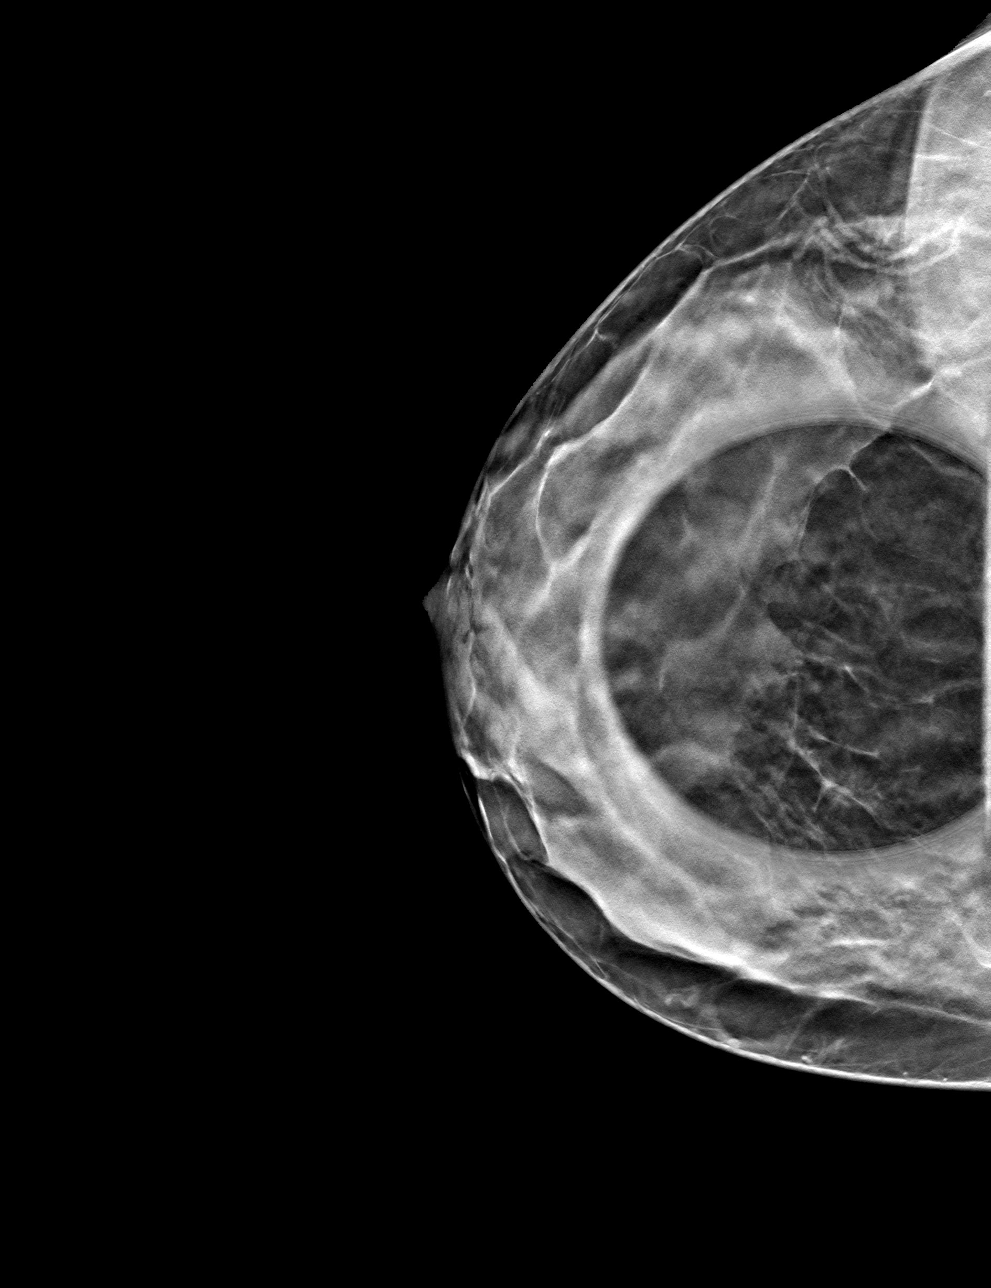

[R ML tomo · tomo slice 28/55.0]
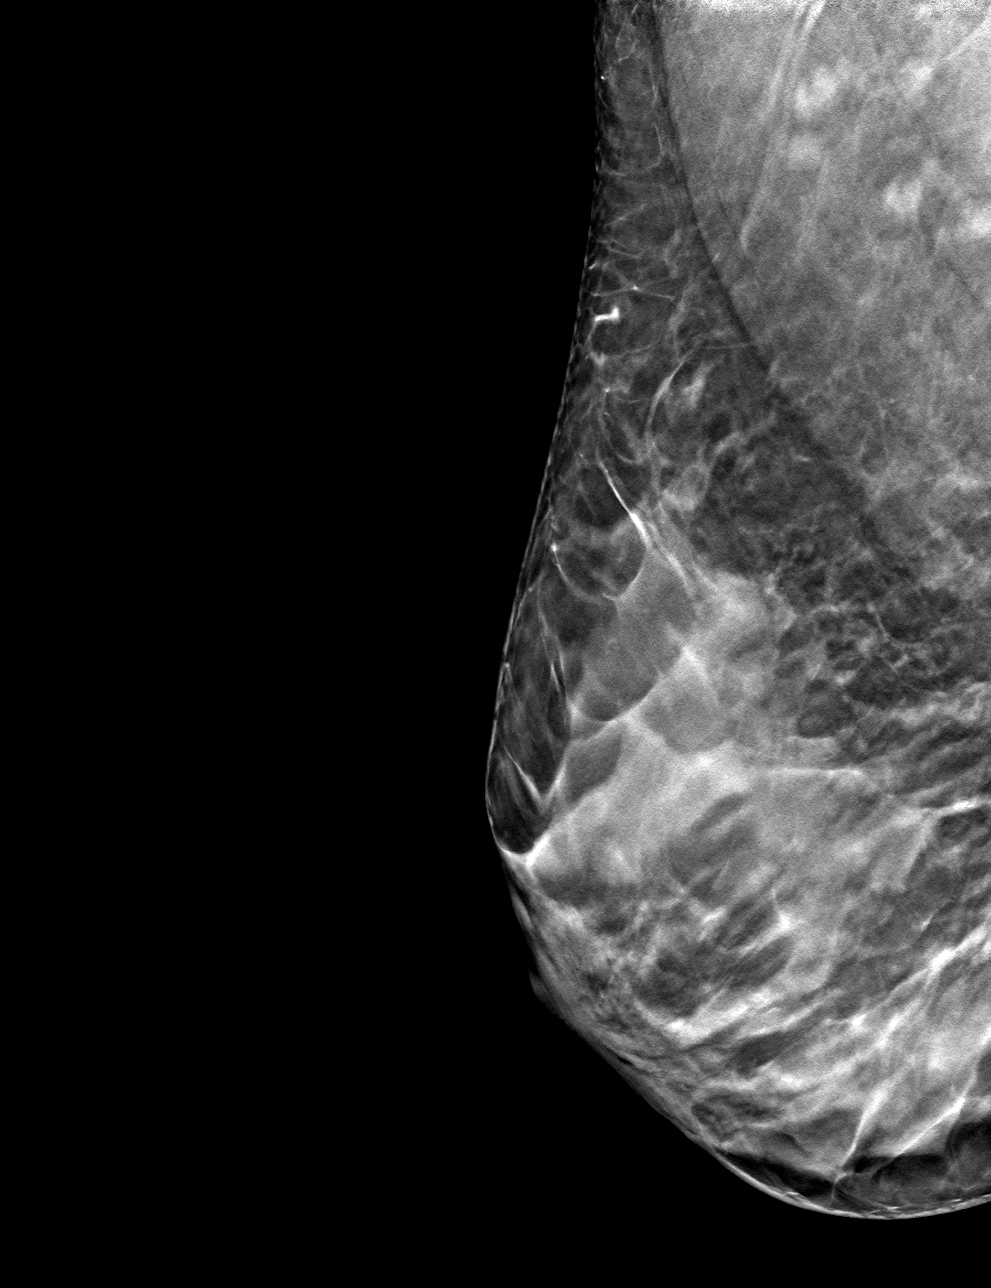

[4 of 12 positions shown; findings below may reference images not displayed]

ACR Breast Density Category d: The breast tissue is extremely dense,
which lowers the sensitivity of mammography.
FINDINGS: Previously described, possible asymmetry in the deep central right
breast on the cc projection only resolves into well dispersed
fibroglandular tissue on today's additional views. No suspicious
findings are identified.

Mammographic images were processed with CAD.
IMPRESSION: No mammographic evidence of malignancy.

RECOMMENDATION:
Routine annual screening due in July 2021.

I have discussed the findings and recommendations with the patient.
If applicable, a reminder letter will be sent to the patient
regarding the next appointment.

BI-RADS CATEGORY  1: Negative.

## 2023-05-09 ENCOUNTER — Other Ambulatory Visit: Payer: Self-pay | Admitting: Obstetrics and Gynecology

## 2023-05-09 ENCOUNTER — Other Ambulatory Visit (HOSPITAL_COMMUNITY)
Admission: RE | Admit: 2023-05-09 | Discharge: 2023-05-09 | Disposition: A | Discharge status: Home / Self Care | Payer: 59 | Source: Ambulatory Visit | Attending: Obstetrics and Gynecology | Admitting: Obstetrics and Gynecology

## 2023-05-09 DIAGNOSIS — Z01419 Encounter for gynecological examination (general) (routine) without abnormal findings: Secondary | ICD-10-CM | POA: Diagnosis present

## 2023-05-11 LAB — CYTOLOGY - PAP
Comment: NEGATIVE
Diagnosis: NEGATIVE
High risk HPV: NEGATIVE

## 2023-07-26 ENCOUNTER — Other Ambulatory Visit: Payer: Self-pay | Admitting: Obstetrics and Gynecology

## 2023-07-26 DIAGNOSIS — Z1231 Encounter for screening mammogram for malignant neoplasm of breast: Secondary | ICD-10-CM

## 2023-08-10 ENCOUNTER — Ambulatory Visit: Payer: 59

## 2023-08-29 ENCOUNTER — Ambulatory Visit: Payer: 59

## 2024-05-31 DIAGNOSIS — Z01419 Encounter for gynecological examination (general) (routine) without abnormal findings: Secondary | ICD-10-CM | POA: Diagnosis not present

## 2024-07-19 ENCOUNTER — Ambulatory Visit
Admission: RE | Admit: 2024-07-19 | Discharge: 2024-07-19 | Disposition: A | Discharge status: Home / Self Care | Source: Ambulatory Visit | Attending: Obstetrics and Gynecology | Admitting: Obstetrics and Gynecology

## 2024-07-19 DIAGNOSIS — Z1231 Encounter for screening mammogram for malignant neoplasm of breast: Secondary | ICD-10-CM

## 2024-07-19 DIAGNOSIS — K5730 Diverticulosis of large intestine without perforation or abscess without bleeding: Secondary | ICD-10-CM | POA: Diagnosis not present

## 2024-07-19 DIAGNOSIS — Z1211 Encounter for screening for malignant neoplasm of colon: Secondary | ICD-10-CM | POA: Diagnosis not present
